# Patient Record
Sex: Female | Born: 1960 | Hispanic: Yes | Marital: Married | Smoking: Never smoker
Health system: Southern US, Community
[De-identification: ages and names within clinical notes are randomized; demographics above are authoritative.]

## PROBLEM LIST (undated history)

## (undated) DIAGNOSIS — I10 Essential (primary) hypertension: Secondary | ICD-10-CM

## (undated) DIAGNOSIS — K56609 Unspecified intestinal obstruction, unspecified as to partial versus complete obstruction: Secondary | ICD-10-CM

## (undated) HISTORY — PX: ABDOMINAL SURGERY: SHX537

---

## 2016-10-12 ENCOUNTER — Encounter (HOSPITAL_COMMUNITY): Payer: Self-pay | Admitting: *Deleted

## 2016-10-12 ENCOUNTER — Emergency Department (HOSPITAL_COMMUNITY): Payer: Self-pay

## 2016-10-12 ENCOUNTER — Inpatient Hospital Stay (HOSPITAL_COMMUNITY)
Admission: EM | Admit: 2016-10-12 | Discharge: 2016-10-14 | DRG: 392 | Disposition: A | Discharge status: Home / Self Care | Payer: Self-pay | Attending: Family Medicine | Admitting: Family Medicine

## 2016-10-12 DIAGNOSIS — Z0189 Encounter for other specified special examinations: Secondary | ICD-10-CM

## 2016-10-12 DIAGNOSIS — R748 Abnormal levels of other serum enzymes: Secondary | ICD-10-CM

## 2016-10-12 DIAGNOSIS — R740 Nonspecific elevation of levels of transaminase and lactic acid dehydrogenase [LDH]: Secondary | ICD-10-CM | POA: Diagnosis present

## 2016-10-12 DIAGNOSIS — R109 Unspecified abdominal pain: Secondary | ICD-10-CM

## 2016-10-12 DIAGNOSIS — I10 Essential (primary) hypertension: Secondary | ICD-10-CM | POA: Diagnosis present

## 2016-10-12 DIAGNOSIS — K56609 Unspecified intestinal obstruction, unspecified as to partial versus complete obstruction: Secondary | ICD-10-CM

## 2016-10-12 DIAGNOSIS — Z9049 Acquired absence of other specified parts of digestive tract: Secondary | ICD-10-CM

## 2016-10-12 DIAGNOSIS — R1013 Epigastric pain: Principal | ICD-10-CM | POA: Diagnosis present

## 2016-10-12 DIAGNOSIS — R001 Bradycardia, unspecified: Secondary | ICD-10-CM | POA: Diagnosis present

## 2016-10-12 HISTORY — DX: Unspecified intestinal obstruction, unspecified as to partial versus complete obstruction: K56.609

## 2016-10-12 HISTORY — DX: Essential (primary) hypertension: I10

## 2016-10-12 LAB — CBC WITH DIFFERENTIAL/PLATELET
Basophils Absolute: 0 10*3/uL (ref 0.0–0.1)
Basophils Relative: 0 %
Eosinophils Absolute: 0.1 10*3/uL (ref 0.0–0.7)
Eosinophils Relative: 1 %
HEMATOCRIT: 42.7 % (ref 36.0–46.0)
HEMOGLOBIN: 15.1 g/dL — AB (ref 12.0–15.0)
LYMPHS ABS: 1.7 10*3/uL (ref 0.7–4.0)
LYMPHS PCT: 21 %
MCH: 30.5 pg (ref 26.0–34.0)
MCHC: 35.4 g/dL (ref 30.0–36.0)
MCV: 86.3 fL (ref 78.0–100.0)
Monocytes Absolute: 0.3 10*3/uL (ref 0.1–1.0)
Monocytes Relative: 4 %
NEUTROS PCT: 74 %
Neutro Abs: 6.1 10*3/uL (ref 1.7–7.7)
Platelets: 175 10*3/uL (ref 150–400)
RBC: 4.95 MIL/uL (ref 3.87–5.11)
RDW: 12.6 % (ref 11.5–15.5)
WBC: 8.3 10*3/uL (ref 4.0–10.5)

## 2016-10-12 LAB — COMPREHENSIVE METABOLIC PANEL
ALK PHOS: 96 U/L (ref 38–126)
ALT: 154 U/L — AB (ref 14–54)
AST: 157 U/L — ABNORMAL HIGH (ref 15–41)
Albumin: 4.3 g/dL (ref 3.5–5.0)
Anion gap: 11 (ref 5–15)
BUN: 11 mg/dL (ref 6–20)
CALCIUM: 9.5 mg/dL (ref 8.9–10.3)
CO2: 27 mmol/L (ref 22–32)
CREATININE: 0.51 mg/dL (ref 0.44–1.00)
Chloride: 98 mmol/L — ABNORMAL LOW (ref 101–111)
Glucose, Bld: 148 mg/dL — ABNORMAL HIGH (ref 65–99)
Potassium: 3.9 mmol/L (ref 3.5–5.1)
Sodium: 136 mmol/L (ref 135–145)
Total Bilirubin: 0.9 mg/dL (ref 0.3–1.2)
Total Protein: 7.8 g/dL (ref 6.5–8.1)

## 2016-10-12 LAB — URINALYSIS, ROUTINE W REFLEX MICROSCOPIC
BILIRUBIN URINE: NEGATIVE
GLUCOSE, UA: NEGATIVE mg/dL
HGB URINE DIPSTICK: NEGATIVE
Ketones, ur: NEGATIVE mg/dL
Leukocytes, UA: NEGATIVE
Nitrite: NEGATIVE
Protein, ur: NEGATIVE mg/dL
SPECIFIC GRAVITY, URINE: 1.005 (ref 1.005–1.030)
pH: 8.5 — ABNORMAL HIGH (ref 5.0–8.0)

## 2016-10-12 LAB — LIPASE, BLOOD: LIPASE: 21 U/L (ref 11–51)

## 2016-10-12 MED ORDER — SODIUM CHLORIDE 0.9 % IV BOLUS (SEPSIS)
1000.0000 mL | Freq: Once | INTRAVENOUS | Status: AC
  Administered 2016-10-12: 1000 mL via INTRAVENOUS

## 2016-10-12 MED ORDER — MORPHINE SULFATE (PF) 4 MG/ML IV SOLN
4.0000 mg | Freq: Once | INTRAVENOUS | Status: AC
  Administered 2016-10-12: 4 mg via INTRAVENOUS
  Filled 2016-10-12: qty 1

## 2016-10-12 MED ORDER — ONDANSETRON HCL 4 MG/2ML IJ SOLN
4.0000 mg | Freq: Once | INTRAMUSCULAR | Status: AC
  Administered 2016-10-12: 4 mg via INTRAVENOUS
  Filled 2016-10-12: qty 2

## 2016-10-12 MED ORDER — IOPAMIDOL (ISOVUE-300) INJECTION 61%
INTRAVENOUS | Status: AC
  Administered 2016-10-12: 100 mL
  Filled 2016-10-12: qty 100

## 2016-10-12 NOTE — ED Provider Notes (Addendum)
[facility]-EM[facility]ENCY DEPT Provider Note   CSN: [id number] Arrival date & time: 10/12/16  1726     History   Chief Complaint Chief Complaint  Patient presents with  . Abdominal Pain    HPI Joyce Weeks is a 55 y.o. female.  Level V caveat for language barrier and urgent need for intervention. Language line used. Patient states epigastric pain with associated vomiting starting earlier this afternoon.. Past medical history includes appendectomy, bowel obstruction, questionable partial colectomy. No fever, sweats, chills, diarrhea.      Past Medical History:  Diagnosis Date  . Bowel obstruction   . Hypertension     Patient Active Problem List   Diagnosis Date Noted  . Elevated liver enzymes   . Hypertension   . Bradycardia   . Abdominal pain 10/12/2016    Past Surgical History:  Procedure Laterality Date  . ABDOMINAL SURGERY     bowel obstruction    OB History    No data available       Home Medications    Prior to Admission medications   Not on File    Family History No family history on file.  Social History Social History  Substance Use Topics  . Smoking status: Never Smoker  . Smokeless tobacco: Never Used  . Alcohol use No     Allergies   Patient has no known allergies.   Review of Systems Review of Systems  Reason unable to perform ROS: language barrier.     Physical Exam Updated Vital Signs BP (!) 159/58 (BP Location: Left Arm)   Pulse (!) 55   Temp 98.3 F (36.8 C) (Oral)   Resp 16   Wt 134 lb 0.6 oz (60.8 kg)   SpO2 99%   Physical Exam  Constitutional: She is oriented to person, place, and time. She appears well-developed and well-nourished.  HENT:  Head: Normocephalic and atraumatic.  Eyes: Conjunctivae are normal.  Neck: Neck supple.  Cardiovascular: Normal rate and regular rhythm.   Pulmonary/Chest: Effort normal and breath sounds normal.  Abdominal:  Minimal generalized abdominal tenderness  Musculoskeletal:  Normal range of motion.  Neurological: She is alert and oriented to person, place, and time.  Skin: Skin is warm and dry.  Psychiatric: She has a normal mood and affect. Her behavior is normal.  Nursing note and vitals reviewed.    ED Treatments / Results  Labs (all labs ordered are listed, but only abnormal results are displayed) Labs Reviewed  CBC WITH DIFFERENTIAL/PLATELET - Abnormal; Notable for the following:       Result Value   Hemoglobin 15.1 (*)    All other components within normal limits  COMPREHENSIVE METABOLIC PANEL - Abnormal; Notable for the following:    Chloride 98 (*)    Glucose, Bld 148 (*)    AST 157 (*)    ALT 154 (*)    All other components within normal limits  URINALYSIS, ROUTINE W REFLEX MICROSCOPIC (NOT AT [facility]) - Abnormal; Notable for the following:    pH 8.5 (*)    All other components within normal limits  CBC - Abnormal; Notable for the following:    Platelets 139 (*)    All other components within normal limits  COMPREHENSIVE METABOLIC PANEL - Abnormal; Notable for the following:    Glucose, Bld 157 (*)    Calcium 8.0 (*)    Total Protein 5.8 (*)    Albumin 3.0 (*)    AST 99 (*)    ALT 103 (*)  All other components within normal limits  CBC - Abnormal; Notable for the following:    HCT 35.7 (*)    Platelets 143 (*)    All other components within normal limits  CBC - Abnormal; Notable for the following:    Platelets 143 (*)    All other components within normal limits  BASIC METABOLIC PANEL - Abnormal; Notable for the following:    Calcium 8.5 (*)    All other components within normal limits  HEPATIC FUNCTION PANEL - Abnormal; Notable for the following:    AST 109 (*)    ALT 112 (*)    All other components within normal limits  LIPASE, BLOOD  CREATININE, SERUM  HEPATITIS PANEL, ACUTE    EKG  EKG Interpretation  Date/Time:  Thursday October 12 2016 23:59:02 EST Ventricular Rate:  57 PR Interval:    QRS Duration: 91 QT  Interval:  486 QTC Calculation: 474 R Axis:   96 Text Interpretation:  Sinus rhythm Borderline right axis deviation No previous tracing Confirmed by Denton LankSTEINL  MD, Caryn BeeKEVIN ([id number]) on 10/13/2016 5:13:50 PM       Radiology No results found.  Procedures Procedures (including critical care time)  Medications Ordered in ED Medications  ondansetron (ZOFRAN) injection 4 mg (4 mg Intravenous Given 10/12/16 1835)  morphine 4 MG/ML injection 4 mg (4 mg Intravenous Given 10/12/16 1835)  sodium chloride 0.9 % bolus 1,000 mL (0 mLs Intravenous Stopped 10/12/16 1934)  iopamidol (ISOVUE-300) 61 % injection (100 mLs  Contrast Given 10/12/16 2005)  ondansetron (ZOFRAN) injection 4 mg (4 mg Intravenous Given 10/12/16 2352)  sodium chloride 0.9 % bolus 1,000 mL (0 mLs Intravenous Stopped 10/13/16 0118)     Initial Impression / Assessment and Plan / ED Course  I have reviewed the triage vital signs and the nursing notes.  Pertinent labs & imaging results that were available during my care of the patient were reviewed by me and considered in my medical decision making (see chart for details).  Clinical Course     Patient presents with epigastric abdominal pain and vomiting. White count is normal but liver functions are slightly elevated. CT abdomen/pelvis consistent with enteritis. Will hydrate, treat pain, admit to observation. Discussed with family practice resident.  Final Clinical Impressions(s) / ED Diagnoses   Final diagnoses:  Abdominal pain, unspecified abdominal location  Elevated liver enzymes  Encounter for imaging study to confirm nasogastric (NG) tube placement  Small bowel obstruction  Abdominal pain  SBO (small bowel obstruction)    New Prescriptions There are no discharge medications for this patient.    Donnetta HutchingBrian Tryniti Laatsch, MD 10/12/16 [id number]    Donnetta HutchingBrian Kaylena Pacifico, MD 10/23/16 [phone]

## 2016-10-12 NOTE — ED Notes (Signed)
Pt back from CT

## 2016-10-12 NOTE — ED Notes (Signed)
Patient transported to CT 
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
## 2016-10-12 NOTE — ED Triage Notes (Addendum)
PT states epigastric/umbilical pain and vomiting since this afternoon.  Pt appears in great pain.  Hx of surgery for bowel obstruction and states MD told her to come to ED if she ever had this type of pain.  Abdomen very tender on palpation.

## 2016-10-13 ENCOUNTER — Observation Stay (HOSPITAL_COMMUNITY): Payer: Self-pay

## 2016-10-13 DIAGNOSIS — R001 Bradycardia, unspecified: Secondary | ICD-10-CM | POA: Insufficient documentation

## 2016-10-13 DIAGNOSIS — R1084 Generalized abdominal pain: Secondary | ICD-10-CM

## 2016-10-13 DIAGNOSIS — R748 Abnormal levels of other serum enzymes: Secondary | ICD-10-CM | POA: Insufficient documentation

## 2016-10-13 DIAGNOSIS — I10 Essential (primary) hypertension: Secondary | ICD-10-CM | POA: Insufficient documentation

## 2016-10-13 LAB — CBC
HCT: 35.7 % — ABNORMAL LOW (ref 36.0–46.0)
HEMATOCRIT: 36.2 % (ref 36.0–46.0)
Hemoglobin: 12.1 g/dL (ref 12.0–15.0)
Hemoglobin: 12.2 g/dL (ref 12.0–15.0)
MCH: 29.3 pg (ref 26.0–34.0)
MCH: 29.6 pg (ref 26.0–34.0)
MCHC: 33.7 g/dL (ref 30.0–36.0)
MCHC: 33.9 g/dL (ref 30.0–36.0)
MCV: 87 fL (ref 78.0–100.0)
MCV: 87.3 fL (ref 78.0–100.0)
PLATELETS: 139 10*3/uL — AB (ref 150–400)
PLATELETS: 143 10*3/uL — AB (ref 150–400)
RBC: 4.09 MIL/uL (ref 3.87–5.11)
RBC: 4.16 MIL/uL (ref 3.87–5.11)
RDW: 12.4 % (ref 11.5–15.5)
RDW: 12.6 % (ref 11.5–15.5)
WBC: 6.1 10*3/uL (ref 4.0–10.5)
WBC: 6.6 10*3/uL (ref 4.0–10.5)

## 2016-10-13 LAB — COMPREHENSIVE METABOLIC PANEL
ALK PHOS: 63 U/L (ref 38–126)
ALT: 103 U/L — AB (ref 14–54)
AST: 99 U/L — AB (ref 15–41)
Albumin: 3 g/dL — ABNORMAL LOW (ref 3.5–5.0)
Anion gap: 7 (ref 5–15)
BUN: 9 mg/dL (ref 6–20)
CALCIUM: 8 mg/dL — AB (ref 8.9–10.3)
CHLORIDE: 108 mmol/L (ref 101–111)
CO2: 22 mmol/L (ref 22–32)
CREATININE: 0.5 mg/dL (ref 0.44–1.00)
GFR calc non Af Amer: >60 mL/min (ref >60)
Glucose, Bld: 157 mg/dL — ABNORMAL HIGH (ref 65–99)
Potassium: 4 mmol/L (ref 3.5–5.1)
SODIUM: 137 mmol/L (ref 135–145)
Total Bilirubin: 0.8 mg/dL (ref 0.3–1.2)
Total Protein: 5.8 g/dL — ABNORMAL LOW (ref 6.5–8.1)

## 2016-10-13 LAB — CREATININE, SERUM
Creatinine, Ser: 0.56 mg/dL (ref 0.44–1.00)
GFR calc Af Amer: >60 mL/min (ref >60)
GFR calc non Af Amer: >60 mL/min (ref >60)

## 2016-10-13 MED ORDER — ONDANSETRON HCL 4 MG/2ML IJ SOLN: 4.0000 mg | Freq: Four times a day (QID) | INTRAMUSCULAR | Status: DC

## 2016-10-13 MED ORDER — PROCHLORPERAZINE EDISYLATE 5 MG/ML IJ SOLN
2.5000 mg | Freq: Four times a day (QID) | INTRAMUSCULAR | Status: DC | PRN
Start: 2016-10-13 — End: 2016-10-14
  Filled 2016-10-13: qty 2

## 2016-10-13 MED ORDER — KETOROLAC TROMETHAMINE 15 MG/ML IJ SOLN
15.0000 mg | Freq: Four times a day (QID) | INTRAMUSCULAR | Status: DC | PRN
Start: 2016-10-13 — End: 2016-10-14

## 2016-10-13 MED ORDER — DIATRIZOATE MEGLUMINE & SODIUM 66-10 % PO SOLN: 90.0000 mL | Freq: Once | ORAL | Status: DC

## 2016-10-13 MED ORDER — SODIUM CHLORIDE 0.9 % IV SOLN: INTRAVENOUS | Status: DC

## 2016-10-13 MED ORDER — SODIUM CHLORIDE 0.9 % IV SOLN
INTRAVENOUS | Status: DC
  Administered 2016-10-13 – 2016-10-14 (×2): via INTRAVENOUS

## 2016-10-13 MED ORDER — ENOXAPARIN SODIUM 40 MG/0.4ML ~~LOC~~ SOLN
40.0000 mg | SUBCUTANEOUS | Status: DC
  Administered 2016-10-14: 40 mg via SUBCUTANEOUS
  Filled 2016-10-13 (×2): qty 0.4

## 2016-10-13 NOTE — Progress Notes (Signed)
Pt roomed to unit. Admission assessment done via Spanish interpreter as pt is completely none english speaking

## 2016-10-13 NOTE — ED Notes (Signed)
Pt talked to on stratus about moving rooms in the department. Pt has no questions at this time and understands what is going on.

## 2016-10-13 NOTE — Progress Notes (Signed)
Family Medicine Teaching Service Daily Progress Note Intern Pager: [phone]  Patient name: Joyce Weeks Medical record number: [id number] Date of birth: [birth year] Age: 55 y.o. Gender: female  Primary Care Provider: No PCP Per Patient Consultants: None Code Status: Full Code  Pt Overview and Major Events to Date:  1. Admitted to FMTS on 10/12/16  Assessment and Plan: Joyce Weeks is a 55 y.o. female presenting with acute abdominal pain and emesis x 1 mainly in the epigastric region of uncertain etiology. PMH significant for history of appendectomy and partial resection of bowel due to ruptured appendicitis per patient (2 years ago) and HTN (not on medications)  Abdominal Pain: Epigastric pain that is intermittent with sudden onset last night with radiation to the lower abdomen. Tenderness to palpation of epigastric region and lower central abdomen without guarding or rebound. Not consistent with acute abdomen. CT abdomen with nonspecific findings with mildly prominent fluid-filled loops of bowel within the R lower quadrant and adjacent decompressed loops of small bowel within the right lower quadrant which appear to demonstrate hyper enhancement. Dicussed further with radiologist on call over the phone. Radiologist reports is could be an early or developing SBO. There is no high grade obstruction. Differentials include early SBO (does have history of abdominal surgeries so possible to have adhesions), ileus, gastroenteritis. Vitals are stable. No leukocytosis. Normal lipase. UA is unremarkable.  Absent bowel sounds this AM. Toradol and compazine for pain and nausea PRN.  No requests for medication.  Feels bloated, but unable to pass gas.  Repeat KUB at 1PM.  If patient does not pass gas will need to attempt to decompress with NGT and potentially call surgery.  - NPO for bowel rest - NS @100cc /hr - Serial KUB and consider NGT if no flatus - compazine 2.5mg  IV q 6 hr PRN for nausea  (avoiding zofran/phenergan in the setting of prolonged QTc ( 474) - Toradol 15mg  IV q 6 hr PRN for pain    Transaminitis: Elevated AST and ALT. Normal Alkaline phosphatase and bilirubin. Unclear if this is acute vs chronic as no past lab work to compare. No history of alcohol use or drug use. No gall stones. No RUQ pain. CT abdomen with signs of steatosis of liver, which could represent NASH.  Chronic hepatitis B can also have normal AST/ALT. - repeat CMP in the AM   - f/u hepatitis panel - reasonable to consider iron panel to work up hemochromotosis  Bradycardia, asymptomatic: EKG with sinus bradycardia with right axis deviation. Borderline 1st degree AV block. No signs of ischemia. No cardiac history per patient.  Denies any SOB or dizziness.  Takes no medications to account for finding.  - continue to monitor   FEN/GI: NPO bowel rest; NS @100cc /hr  Prophylaxis: Lovenox  Disposition: discharge pending medical improvement  Subjective:  Patient feels bloated, but is unable to pass gas.  Denies any NV.  Denies abdominal pain, CP or SOB.    Objective: Temp:  [97.8 F (36.6 C)] 97.8 F (36.6 C) (11/30 1736) Pulse Rate:  [47-67] 50 (12/01 0700) Resp:  [11-16] 14 (12/01 0230) BP: (107-187)/(50-75) 109/61 (12/01 0700) SpO2:  [88 %-100 %] 96 % (12/01 0700) Physical Exam: General:NAD, resting comfortably  Cardiovascular: bradycardia, regular rhythm, no murmur, rubs, or gallops Respiratory: on room air, normal effort, CTAB Gastrointestinal: soft, mild epigastric tenderness, non-distended, no organomegaly, no RUQ tenderness, no peritoneal signs or guarding MSK: able to move all extremities spontaneously Neuro: grossly intact, alert, awake, and oriented  Psych: mood and affect appropriate.  Laboratory:  Recent Labs Lab 10/12/16 1800 10/13/16 0011 10/13/16 0346  WBC 8.3 6.6 6.1  HGB 15.1* 12.2 12.1  HCT 42.7 36.2 35.7*  PLT 175 139* 143*    Recent Labs Lab 10/12/16 1800  10/13/16 0011 10/13/16 0346  NA 136  --  137  K 3.9  --  4.0  CL 98*  --  108  CO2 27  --  22  BUN 11  --  9  CREATININE 0.51 0.56 0.50  CALCIUM 9.5  --  8.0*  PROT 7.8  --  5.8*  BILITOT 0.9  --  0.8  ALKPHOS 96  --  63  ALT 154*  --  103*  AST 157*  --  99*  GLUCOSE 148*  --  157*    Imaging/Diagnostic Tests: Ct Abdomen Pelvis W Contrast  Result Date: 10/12/2016 CLINICAL DATA:  Patient with lower abdominal pain an nausea. EXAM: CT ABDOMEN AND PELVIS WITH CONTRAST TECHNIQUE: Multidetector CT imaging of the abdomen and pelvis was performed using the standard protocol following bolus administration of intravenous contrast. CONTRAST:  100 ISOVUE-300 IOPAMIDOL (ISOVUE-300) INJECTION 61% COMPARISON:  None. FINDINGS: Lower chest: Normal heart size. Minimal dependent atelectasis within the bilateral lower lobes. No pleural effusion. Hepatobiliary: Liver is diffusely low in attenuation compatible with steatosis. No focal lesion is identified. Gallbladder is unremarkable. Pancreas: Unremarkable Spleen: Unremarkable Adrenals/Urinary Tract: The adrenal glands are normal. Kidneys enhance symmetrically with contrast. No hydronephrosis. Urinary bladder is unremarkable. Stomach/Bowel: Multiple mildly prominent loops of small bowel are demonstrated within the central abdomen and right lower quadrant. Small bowel feces is demonstrated suggestive of slow transit. The distal ileum is decompressed with suggestion of mild hyper enhancement. No free intraperitoneal air. Vascular/Lymphatic: Normal caliber abdominal aorta. Peripheral calcified atherosclerotic plaque. No retroperitoneal lymphadenopathy. There a few prominent loops of scratch the there a few prominent lymph nodes within the small bowel mesentery in the right lower quadrant measuring up to 11 mm (image 28; series 5). Reproductive: Uterus is unremarkable. Small amount of adjacent free fluid. Other: None. Musculoskeletal: No aggressive or acute appearing  osseous lesions. Lumbar spine degenerative changes. IMPRESSION: There are multiple mildly prominent fluid-filled loops of bowel within the right lower quadrant. Small amount of surrounding free fluid. Additionally there are adjacent decompressed loops of small bowel within the right lower quadrant which appear to demonstrate hyper enhancement. Findings are nonspecific however likely secondary to an enteritis. Considerations include infectious or inflammatory etiologies. Crohn's disease is not excluded. Electronically Signed   By: Annia Beltrew  Davis M.D.   On: 10/12/2016 20:56    Renne Muscaaniel L Keaghan Bowens, MD 10/13/2016, 8:03 AM PGY-1, [facility] FPTS Intern pager: [phone], text pages welcome

## 2016-10-13 NOTE — ED Notes (Signed)
Patient transported to X-ray 

## 2016-10-13 NOTE — ED Notes (Signed)
Pt returned from xray

## 2016-10-13 NOTE — ED Notes (Signed)
Spoke with Dr. Allayne StackMckel, will review orders and make changes as needed.

## 2016-10-13 NOTE — ED Notes (Signed)
Attempting to page family services for clarification of orders.

## 2016-10-14 ENCOUNTER — Inpatient Hospital Stay (HOSPITAL_COMMUNITY): Payer: Self-pay

## 2016-10-14 LAB — CBC
HEMATOCRIT: 37.6 % (ref 36.0–46.0)
HEMOGLOBIN: 12.7 g/dL (ref 12.0–15.0)
MCH: 29.4 pg (ref 26.0–34.0)
MCHC: 33.8 g/dL (ref 30.0–36.0)
MCV: 87 fL (ref 78.0–100.0)
Platelets: 143 10*3/uL — ABNORMAL LOW (ref 150–400)
RBC: 4.32 MIL/uL (ref 3.87–5.11)
RDW: 12.2 % (ref 11.5–15.5)
WBC: 4.7 10*3/uL (ref 4.0–10.5)

## 2016-10-14 LAB — HEPATIC FUNCTION PANEL
ALBUMIN: 3.5 g/dL (ref 3.5–5.0)
ALK PHOS: 66 U/L (ref 38–126)
ALT: 112 U/L — ABNORMAL HIGH (ref 14–54)
AST: 109 U/L — ABNORMAL HIGH (ref 15–41)
BILIRUBIN DIRECT: 0.2 mg/dL (ref 0.1–0.5)
BILIRUBIN TOTAL: 1 mg/dL (ref 0.3–1.2)
Indirect Bilirubin: 0.8 mg/dL (ref 0.3–0.9)
Total Protein: 6.6 g/dL (ref 6.5–8.1)

## 2016-10-14 LAB — HEPATITIS PANEL, ACUTE
HCV Ab: <0.1 s/co ratio (ref 0.0–0.9)
HEP B S AG: NEGATIVE
Hep A IgM: NEGATIVE
Hep B C IgM: NEGATIVE

## 2016-10-14 LAB — BASIC METABOLIC PANEL
Anion gap: 8 (ref 5–15)
BUN: 8 mg/dL (ref 6–20)
CALCIUM: 8.5 mg/dL — AB (ref 8.9–10.3)
CHLORIDE: 105 mmol/L (ref 101–111)
CO2: 27 mmol/L (ref 22–32)
CREATININE: 0.61 mg/dL (ref 0.44–1.00)
GFR calc non Af Amer: >60 mL/min (ref >60)
GLUCOSE: 96 mg/dL (ref 65–99)
Potassium: 3.8 mmol/L (ref 3.5–5.1)
Sodium: 140 mmol/L (ref 135–145)

## 2016-10-14 MED ORDER — POLYETHYLENE GLYCOL 3350 17 G PO PACK
17.0000 g | PACK | Freq: Every day | ORAL | Status: DC
  Administered 2016-10-14: 17 g via ORAL
  Filled 2016-10-14: qty 1

## 2016-10-14 NOTE — Progress Notes (Signed)
D/c home with family d/c instructions reviewed and translated with demonstrated understanding.  Pain and nausea denied.

## 2016-10-14 NOTE — Progress Notes (Signed)
Family Medicine Teaching Service Daily Progress Note Intern Pager: [phone]  Patient name: Joyce Weeks Medical record number: [id number] Date of birth: December 16, 1960 Age: 55 y.o. Gender: female  Primary Care Provider: No PCP Per Patient Consultants: None Code Status: Full Code  *Patient speaks Spanish, [place] Spanish interpreter used for this visit*  Assessment and Plan: Joyce Weeks is a 55 y.o. female presenting with acute abdominal pain and emesis x 1 mainly in the epigastric region of uncertain etiology. PMH significant for history of appendectomy and partial resection of bowel due to ruptured appendicitis per patient (2 years ago) and HTN (not on medications)  Abdominal Pain: Resolved. Not concerned about SBO at this time as patient has no abdominal pain, nausea, passing flatus, bowel sounds present, and she also has an appetite. She may have had a previous SBO that has resolved. Likely differential is constipation versus gastroenteritis at this point. Afebrile. No leukocytosis. Normal lipase. UA is unremarkable.   - Discontinue nothing by mouth, changed to full liquid diet and see how patient tolerates  - NS @100cc /hr for now, can stop if patient is tolerating diet - compazine 2.5mg  IV q 6 hr PRN for nausea (avoiding zofran/phenergan in the setting of prolonged QTc ( 474) (has not required any) - Toradol 15mg  IV q 6 hr PRN for pain (has not required any)  -can discharge home this afternoon if patient tolerates diet   Transaminitis: Elevated AST and ALT. Normal Alkaline phosphatase and bilirubin. Unclear if this is acute vs chronic as no past lab work to compare. No history of alcohol use or drug use. No gall stones. No RUQ pain. CT abdomen with signs of steatosis of liver, which could represent NASH.  Hep panel negative. - repeat CMP this AM - reasonable to consider iron panel to work up hemochromotosis, can consider an outpatient setting  Bradycardia, asymptomatic: Has  remained in the 50's. EKG with sinus bradycardia with right axis deviation. Borderline 1st degree AV block. No signs of ischemia. No cardiac history per patient.  Denies any SOB or dizziness.  Takes no medications to account for finding.  - continue to monitor   Hypertension: BP 160's systolic yesterday and today. This is up from admission BP. Could have baseline hypertension that was possibly not present on admission.  - She'll need to follow up with a PCP and possibly start an antihypertensive medication if she continues to be hypertensive  FEN/GI: full liquid diet; NS @100cc /hr  Prophylaxis: Lovenox  Disposition: discharge pending ability to tolerate PO  Subjective:  Patient had no acute events overnight. Remains afebrile. Patient has no complaints this morning. She denies any nausea, vomiting, abdominal pain. She states that she is passing flatus and she feels like she needs to have a bowel movement. She would also like to eat something that she is very hungry.  Objective: Temp:  [98.3 F (36.8 C)-98.7 F (37.1 C)] 98.3 F (36.8 C) (12/02 0508) Pulse Rate:  [48-52] 52 (12/02 0508) Resp:  [17-18] 18 (12/02 0508) BP: (159-167)/(60-67) 164/60 (12/02 0508) SpO2:  [97 %-99 %] 98 % (12/02 0508) Weight:  [134 lb 0.6 oz (60.8 kg)-136 lb 7.4 oz (61.9 kg)] 134 lb 0.6 oz (60.8 kg) (12/02 14780508) Physical Exam: General: NAD, laying in bed Cardiovascular: bradycardia, regular rhythm, no murmur, rubs, or gallops Respiratory: Normal work of breathing, clear to auscultation bilaterally Gastrointestinal: soft, nontender, non-distended, no organomegaly, no RUQ tenderness, no peritoneal signs or guarding. Bowel sounds present throughout in all 4 quadrants.  MSK: able to move all extremities spontaneously Neuro: grossly intact, alert, awake, and oriented Psych: mood and affect appropriate.  Laboratory:  Recent Labs Lab 10/13/16 0011 10/13/16 0346 10/14/16 0559  WBC 6.6 6.1 4.7  HGB 12.2 12.1  12.7  HCT 36.2 35.7* 37.6  PLT 139* 143* 143*    Recent Labs Lab 10/12/16 1800 10/13/16 0011 10/13/16 0346 10/14/16 0559  NA 136  --  137 140  K 3.9  --  4.0 3.8  CL 98*  --  108 105  CO2 27  --  22 27  BUN 11  --  9 8  CREATININE 0.51 0.56 0.50 0.61  CALCIUM 9.5  --  8.0* 8.5*  PROT 7.8  --  5.8*  --   BILITOT 0.9  --  0.8  --   ALKPHOS 96  --  63  --   ALT 154*  --  103*  --   AST 157*  --  99*  --   GLUCOSE 148*  --  157* 96    Imaging/Diagnostic Tests: Dg Abd Portable 1v  Result Date: 10/13/2016 CLINICAL DATA:  Pain and vomiting since yesterday. History of bowel obstruction. EXAM: PORTABLE ABDOMEN - 1 VIEW COMPARISON:  None. FINDINGS: The bowel gas pattern is normal. No radio-opaque calculi or other significant radiographic abnormality are seen. IMPRESSION: Negative. Electronically Signed   By: Gerome Samavid  Williams III M.D   On: 10/13/2016 16:15    Beaulah Dinninghristina M Tinia Oravec, MD 10/14/2016, 9:39 AM PGY-2, [facility] FPTS Intern pager: [phone], text pages welcome

## 2016-10-14 NOTE — Discharge Instructions (Signed)
Return to Emergency Department if you develop worsening abdominal pain, no bowel movements, nausea, and vomiting. It is important to go to a Family Medicine doctor after discharge, so [facility].  Dolor abdominal en los adultos (Abdominal Pain, Adult) El dolor abdominal puede tener muchas causas. A menudo, no es grave y Lithuaniamejora sin tratamiento o con tratamiento en la casa. Sin embargo, a Facilities managerveces el dolor abdominal es grave. El mdico revisar sus antecedentes mdicos y le har un examen fsico para tratar de Production assistant, radiodeterminar la causa del dolor abdominal. INSTRUCCIONES PARA EL CUIDADO EN EL HOGAR  Tome los medicamentos de venta libre y los recetados solamente como se lo haya indicado el mdico. No tome un laxante a menos que se lo haya indicado el mdico.  Beba suficiente lquido para Pharmacologistmantener la orina clara o de color amarillo plido.  Controle su afeccin para ver si hay cambios.  Concurra a todas las visitas de control como se lo haya indicado el mdico. Esto es importante. SOLICITE ATENCIN MDICA SI:  El dolor abdominal cambia o empeora.  No tiene apetito o baja de peso sin proponrselo.  Est estreido o tiene diarrea durante ms de 2 o 3das.  Tiene dolor cuando orina o defeca.  El dolor abdominal lo despierta de noche.  El dolor empeora con las comidas, despus de comer o con determinados alimentos.  Tiene vmitos y no puede retener nada.  Tiene fiebre. SOLICITE ATENCIN MDICA DE INMEDIATO SI:  El dolor no desaparece tan pronto como el mdico le dijo que era esperable.  No puede detener los vmitos.  El Engineer, miningdolor se siente solo en zonas del abdomen, como el lado derecho o la parte inferior izquierda del abdomen.  Las heces son sanguinolentas o de color negro, o de aspecto alquitranado.  Tiene dolor intenso, clicos, o meteorismo en el abdomen.  Tiene signos de deshidratacin, por ejemplo:  Larose Kellsrina oscura, muy escasa o falta  de [place].  Labios agrietados.  [employer] Holdings[place]oca sec[place].  Ojos hundidos.  Somnolencia.  Debilidad. Esta informacin no tiene Theme park managercomo fin reemplazar el consejo del mdico. Asegrese de hacerle al mdico cualquier pregunta que tenga. Document Released: 10/30/2005 Document Revised: 11/20/2014 Document Reviewed: 04/12/2016 Elsevier Interactive Patient Education  2017 [employer]

## 2016-10-15 NOTE — Discharge Summary (Signed)
[facility]  Patient name: Joyce Weeks Medical record number: [id number] Date of birth: 09-Aug-1961 Age: 55 y.o. Gender: female Date of Admission: 10/12/2016  Date of Discharge: 10/16/16 Admitting Physician: Leighton Roachodd D McDiarmid, MD  Primary Care Provider: No PCP Per Patient Consultants: None  Indication for Hospitalization: abdominal pain  Discharge Diagnoses/Problem List:  .Active Problems:   Abdominal pain    Disposition: discharge home  Discharge Condition: Stable, improved  Discharge Exam:  From Dr. Pennie RushingGambino's note 10/14/16  General: NAD, laying in bed Cardiovascular: bradycardia, regular rhythm, no murmur, rubs, or gallops Respiratory: Normal work of breathing, clear to auscultation bilaterally Gastrointestinal: soft, nontender, non-distended, no organomegaly, no RUQ tenderness, no peritoneal signs or guarding. Bowel sounds present throughout in all 4 quadrants.  MSK: able to move all extremities spontaneously Neuro: grossly intact, alert, awake, and oriented Psych: mood and affect appropriate.  Brief Hospital Course:  55 year old female presenting with acute abdominal pain and emesis 1 in the epigastric region.  Initial concern for small bowel obstruction given the patient has had abdominal surgeries in the past and was presenting with abdominal pain, nausea hypoactive bowel sounds and inability to pass flatus.  Initial CT showed multiple mildly prominent fluid-filled loops of bowel within the right lower quadrant. There is a small amount of free fluid surrounding the bowel.  There were also adjacent decompressed loops of small bowel within the right lower quadrant.  Discussed case with radiologist who suggested possible early small bowel obstruction. Patient was made NPO and was started on IV fluids and given Compazine for nausea and Toradol for pain medication.  She did not require either medication during admission.  Initial  KUBs did not show any abnormalities in gas pattern, however patient was followed with serial KUBs and these remained normal.  Abdominal pain improved, and patient was passing gas and asking to eat.  At time of discharge she was tolerating diet and pain was improved and she was appropriate for discharge.   Issues for Follow Up:  1. Abdominal pain  Significant Procedures: None  Significant Labs and Imaging:   Recent Labs Lab 10/13/16 0011 10/13/16 0346 10/14/16 0559  WBC 6.6 6.1 4.7  HGB 12.2 12.1 12.7  HCT 36.2 35.7* 37.6  PLT 139* 143* 143*    Recent Labs Lab 10/12/16 1800 10/13/16 0011 10/13/16 0346 10/14/16 0559 10/14/16 1003  NA 136  --  137 140  --   K 3.9  --  4.0 3.8  --   CL 98*  --  108 105  --   CO2 27  --  22 27  --   GLUCOSE 148*  --  157* 96  --   BUN 11  --  9 8  --   CREATININE 0.51 0.56 0.50 0.61  --   CALCIUM 9.5  --  8.0* 8.5*  --   ALKPHOS 96  --  63  --  66  AST 157*  --  99*  --  109*  ALT 154*  --  103*  --  112*  ALBUMIN 4.3  --  3.0*  --  3.5  Dg Abd 1 View  Result Date: 10/14/2016 CLINICAL DATA:  Evaluate for small bowel obstruction. EXAM: ABDOMEN - 1 VIEW COMPARISON:  10/13/2016 FINDINGS: Nonobstructive bowel gas pattern. Small amount of stool in the colon. Stable small calcification or density along the lateral left abdomen. Bone structures are grossly intact. IMPRESSION: No acute abnormality. Electronically Signed   By:  Richarda OverlieAdam  Henn M.D.   On: 10/14/2016 12:54    Results/Tests Pending at Time of Discharge: None  Discharge Medications:    Medication List    You have not been prescribed any medications.     Discharge Instructions: Please refer to Patient Instructions section of EMR for full details.  Patient was counseled important signs and symptoms that should prompt return to medical care, changes in medications, dietary instructions, activity restrictions, and follow up appointments.   Follow-Up Appointments: Follow-up Information     [facility]. Schedule an appointment as soon as possible for a visit.   Specialty:  Family Medicine Contact information: [address] [id number] mc [address]G[place] [id number] [phone]          Renne Muscaaniel L Warden, MD 10/16/2016, 6:45 AM PGY-1, [facility]

## 2018-01-15 IMAGING — CR DG ABD PORTABLE 1V
1 series · 1 of 1 positions shown · non-contrast
Comparison: None.

CLINICAL DATA: Pain and vomiting since yesterday. History of bowel
obstruction.

EXAM:
PORTABLE ABDOMEN - 1 VIEW

[supine ap]
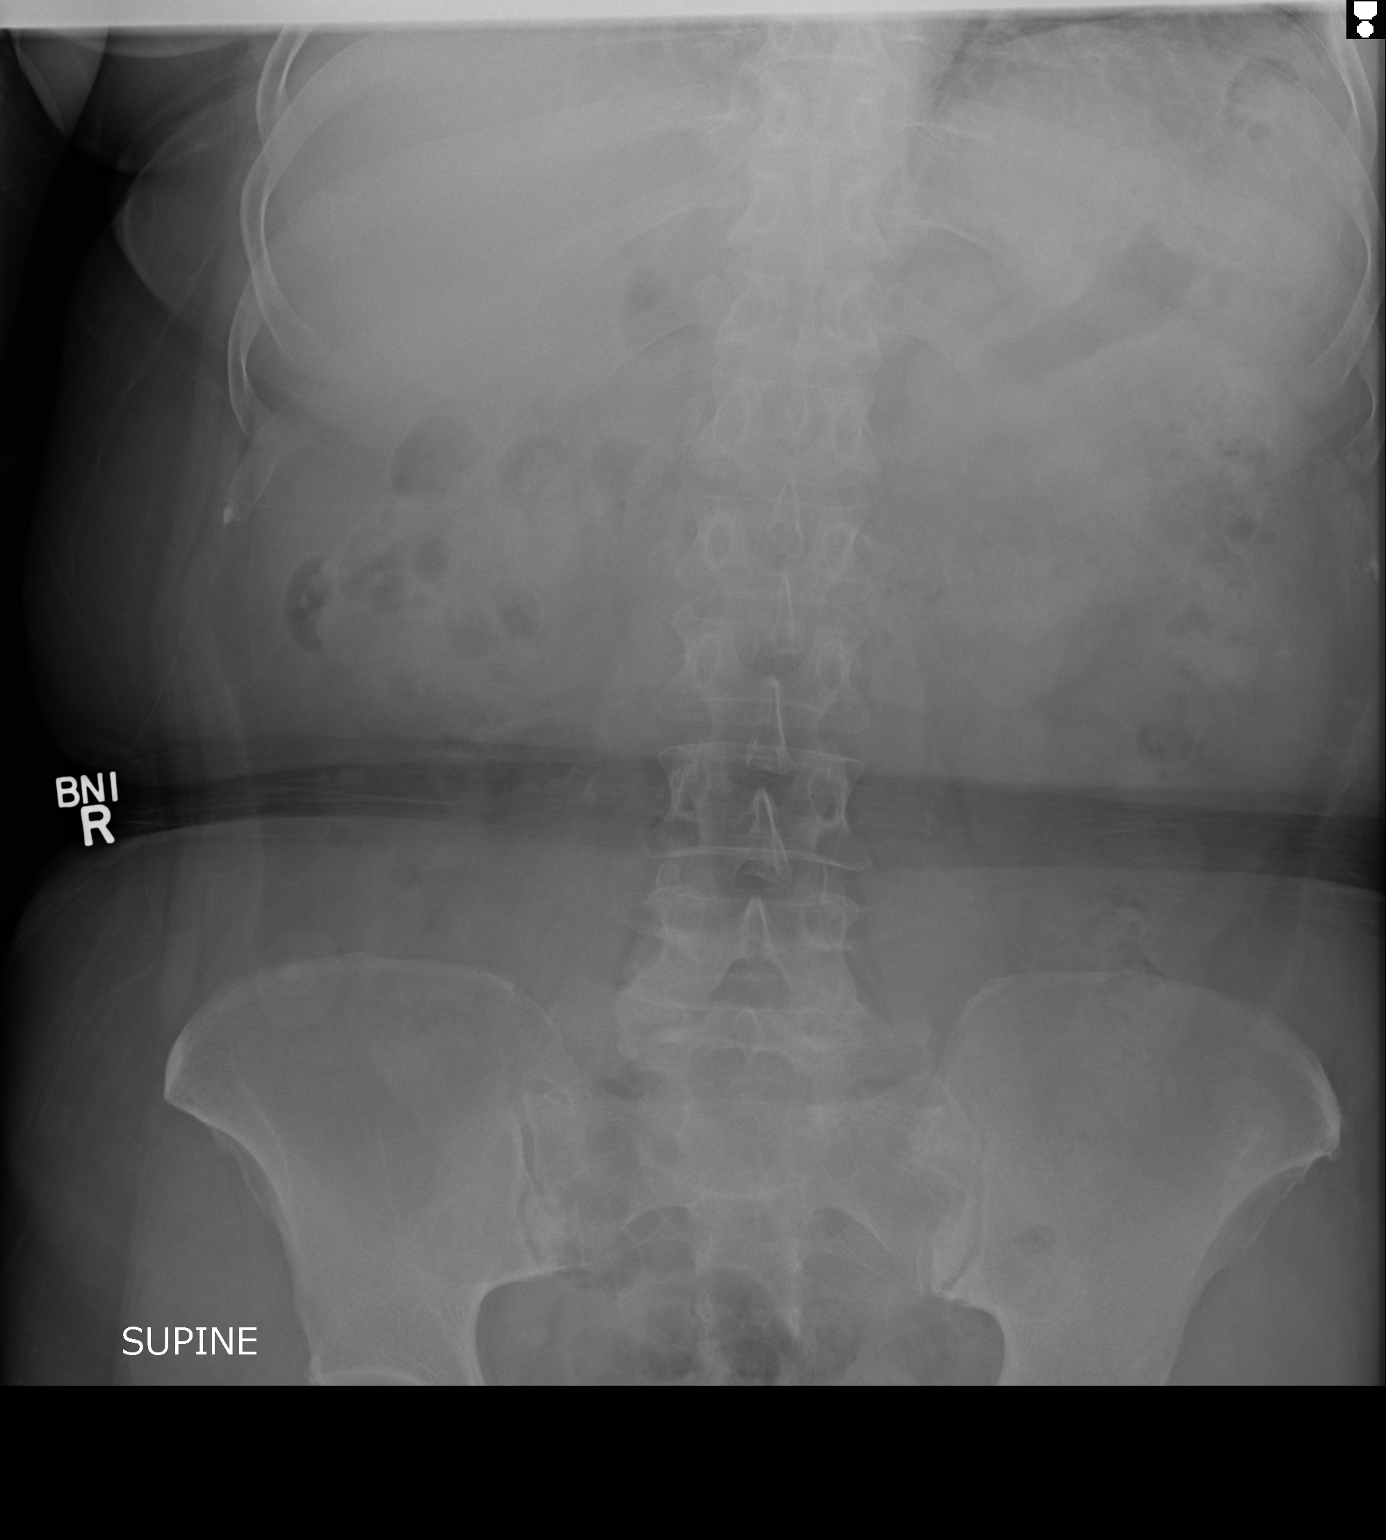

[1 of 1 positions shown; findings below may reference images not displayed]

FINDINGS: The bowel gas pattern is normal. No radio-opaque calculi or other
significant radiographic abnormality are seen.
IMPRESSION: Negative.
